# Patient Record
Sex: Female | Born: 1935 | Race: White | Hispanic: No
Health system: Southern US, Community
[De-identification: ages and names within clinical notes are randomized; demographics above are authoritative.]

---

## 2004-07-12 ENCOUNTER — Ambulatory Visit: Payer: Self-pay | Admitting: Internal Medicine

## 2010-01-18 ENCOUNTER — Ambulatory Visit: Payer: Self-pay | Admitting: Internal Medicine

## 2010-05-28 ENCOUNTER — Ambulatory Visit: Payer: Self-pay | Admitting: Unknown Physician Specialty

## 2012-09-15 ENCOUNTER — Ambulatory Visit: Payer: Self-pay | Admitting: Internal Medicine

## 2012-09-16 ENCOUNTER — Ambulatory Visit: Payer: Self-pay | Admitting: Internal Medicine

## 2013-03-17 LAB — CBC
HGB: 12.7 g/dL (ref 12.0–16.0)
MCH: 30.6 pg (ref 26.0–34.0)
Platelet: 190 10*3/uL (ref 150–440)
RBC: 4.15 10*6/uL (ref 3.80–5.20)
WBC: 22.1 10*3/uL — ABNORMAL HIGH (ref 3.6–11.0)

## 2013-03-17 LAB — URINALYSIS, COMPLETE
Bilirubin,UR: NEGATIVE
Glucose,UR: NEGATIVE mg/dL (ref 0–75)
Ph: 5 (ref 4.5–8.0)
Protein: 30
Specific Gravity: 1.015 (ref 1.003–1.030)
Squamous Epithelial: NONE SEEN
WBC UR: 89 /HPF (ref 0–5)

## 2013-03-17 LAB — COMPREHENSIVE METABOLIC PANEL
Albumin: 3.4 g/dL (ref 3.4–5.0)
Chloride: 104 mmol/L (ref 98–107)
Co2: 21 mmol/L (ref 21–32)
Creatinine: 1.93 mg/dL — ABNORMAL HIGH (ref 0.60–1.30)
EGFR (African American): 27 — ABNORMAL LOW
EGFR (Non-African Amer.): 23 — ABNORMAL LOW
SGPT (ALT): 24 U/L (ref 12–78)
Total Protein: 6.9 g/dL (ref 6.4–8.2)

## 2013-03-17 LAB — LIPASE, BLOOD: Lipase: 78 U/L (ref 73–393)

## 2013-03-18 ENCOUNTER — Inpatient Hospital Stay: Payer: Self-pay | Admitting: Internal Medicine

## 2013-03-18 LAB — CBC WITH DIFFERENTIAL/PLATELET
Bands: 12 %
HCT: 32.7 % — ABNORMAL LOW (ref 35.0–47.0)
HGB: 11.4 g/dL — ABNORMAL LOW (ref 12.0–16.0)
Lymphocytes: 7 %
MCH: 31.2 pg (ref 26.0–34.0)
Metamyelocyte: 2 %
Monocytes: 2 %
RDW: 13.3 % (ref 11.5–14.5)
WBC: 23.5 10*3/uL — ABNORMAL HIGH (ref 3.6–11.0)

## 2013-03-18 LAB — BASIC METABOLIC PANEL
Anion Gap: 8 (ref 7–16)
BUN: 30 mg/dL — ABNORMAL HIGH (ref 7–18)
Calcium, Total: 7.9 mg/dL — ABNORMAL LOW (ref 8.5–10.1)
Chloride: 111 mmol/L — ABNORMAL HIGH (ref 98–107)
Glucose: 109 mg/dL — ABNORMAL HIGH (ref 65–99)
Osmolality: 284 (ref 275–301)

## 2013-03-18 LAB — CLOSTRIDIUM DIFFICILE(ARMC)

## 2013-03-19 LAB — CBC WITH DIFFERENTIAL/PLATELET
Basophil #: 0 10*3/uL (ref 0.0–0.1)
Basophil %: 0.2 %
Eosinophil #: 0.1 10*3/uL (ref 0.0–0.7)
Eosinophil %: 0.5 %
HCT: 29.3 % — ABNORMAL LOW (ref 35.0–47.0)
HGB: 10.2 g/dL — ABNORMAL LOW (ref 12.0–16.0)
Lymphocyte #: 2.1 10*3/uL (ref 1.0–3.6)
MCH: 31.2 pg (ref 26.0–34.0)
MCV: 90 fL (ref 80–100)
Monocyte %: 4.9 %
Neutrophil #: 15.2 10*3/uL — ABNORMAL HIGH (ref 1.4–6.5)
Neutrophil %: 82.9 %
Platelet: 155 10*3/uL (ref 150–440)
RDW: 13.3 % (ref 11.5–14.5)
WBC: 18.3 10*3/uL — ABNORMAL HIGH (ref 3.6–11.0)

## 2013-03-19 LAB — BASIC METABOLIC PANEL
BUN: 22 mg/dL — ABNORMAL HIGH (ref 7–18)
Chloride: 115 mmol/L — ABNORMAL HIGH (ref 98–107)
Co2: 18 mmol/L — ABNORMAL LOW (ref 21–32)
Creatinine: 1.32 mg/dL — ABNORMAL HIGH (ref 0.60–1.30)
EGFR (African American): 42 — ABNORMAL LOW
Glucose: 80 mg/dL (ref 65–99)
Sodium: 142 mmol/L (ref 136–145)

## 2013-03-19 LAB — LIPID PANEL
HDL Cholesterol: 15 mg/dL — ABNORMAL LOW (ref 40–60)
Triglycerides: 134 mg/dL (ref 0–200)

## 2013-03-19 LAB — MAGNESIUM: Magnesium: 1.5 mg/dL — ABNORMAL LOW

## 2013-03-19 LAB — HEMOGLOBIN A1C: Hemoglobin A1C: 5.9 % (ref 4.2–6.3)

## 2013-03-19 LAB — TSH: Thyroid Stimulating Horm: 8.61 u[IU]/mL — ABNORMAL HIGH

## 2013-03-19 LAB — URINE CULTURE

## 2013-03-22 LAB — CULTURE, BLOOD (SINGLE)

## 2013-04-07 ENCOUNTER — Ambulatory Visit: Payer: Self-pay | Admitting: Internal Medicine

## 2013-05-17 ENCOUNTER — Ambulatory Visit: Payer: Self-pay | Admitting: Internal Medicine

## 2014-09-02 NOTE — H&P (Signed)
PATIENT NAME:  Danielle HamburgerSURBER, Cj S MR#:  [id number] DATE OF BIRTH:  05/14/1924  DATE OF ADMISSION:  03/18/2013  PRIMARY CARE PHYSICIAN: Dr. Dewaine Oatsenny Tate  REFERRING PHYSICIAN:  Dr. Cyril LoosenKinner  CHIEF COMPLAINT:  Confusion and one episode of vomiting.   HISTORY OF PRESENT ILLNESS: The patient is an 81 year old pleasant Caucasian female with past medical history of GERD and osteoporosis is presenting to the ER with a chief complaint of altered mental status. The patient was in her usual state of health until the day before yesterday. Yesterday afternoon she called her sister and told her that she was not feeling well. She was sick on her stomach, felt nauseated and vomited x 1. Eventually the patient was found to be quite confused.  Son stated that his aunt called him and told him that his mom was not doing well. The patient was brought into the ER for altered mental status. In the ER, the patient had a CAT scan of the head which has revealed soft tissue swelling and scalp hematoma in the occipital area. With further questioning the son is reporting that the patient fell last week and could not recall whether she hit her in the head or not.  The son thinks that probably during that fall she sustained this scalp hematoma. The patient's son also has reported that the patient is not eating or drinking well since yesterday. Denies fever.  No similar complaints in the past.   PAST MEDICAL HISTORY:  1.  GERD.  2.  Osteoporosis.  3.  Vitamin D deficiency.   PAST SURGICAL HISTORY: None.   ALLERGIES:  No known drug allergies.   PSYCHOSOCIAL HISTORY: Lives at home with son. No history of smoking, alcohol or illicit drug usage.   FAMILY HISTORY: Mom had several transient ischemic attacks. Dad deceased with throat cancer.   HOME MEDICATIONS:  List is not available at this time, it is not reconciled.    REVIEW OF SYSTEMS:  Unobtainable as the patient is with altered mental status.   PHYSICAL EXAMINATION: VITAL  SIGNS: Temperature 97.6, pulse 93 to 94, respirations  18 to 22, blood pressure initially 104/53, during my examination 490/41 with pulse oximetry 96%.  GENERAL APPEARANCE: Not in acute distress. Moderately built and nourished.  HEENT: Normocephalic. Scalp hematomas were noted in the occipital area. Pupils are equal, reacting light and accommodation. No scleral icterus. No conjunctival injection. No sinus tenderness. No postnasal drip. Dry mucous membranes.  NECK: Supple. No JVD or thyromegaly.  LUNGS: Clear to auscultation bilaterally. No accessory muscle usage. Decreased breath sounds at the bases.  CARDIAC: S1, S2 normal. Regular rate and rhythm. No murmurs.  GASTROINTESTINAL: Soft. Bowel sounds are positive in all four quadrants. Nontender, nondistended. No hepatosplenomegaly. No masses felt.  NEUROLOGIC: Awake and alert, but disoriented. Motor and sensory are grossly intact. Reflexes are 2+.  EXTREMITIES: No edema. No cyanosis. Peripheral pulses are 2+.  PSYCHIATRIC:  Mood and affect could not be elicited as the patient is with altered mental status.  SKIN: Warm to touch. Decreased turgor, dry in nature. No rashes. No lesions.   LABORATORY AND IMAGING STUDIES: Chest x-ray portable single view has revealed small bilateral pleural effusions are new, stable, tracheal density felt related to tortuous great vessels.  CAT scan of the head without contrast has revealed soft tissue swelling in the occipital scalp bilaterally, left greater than right, suspicious cephalhematomas. No acute displaced skull fracture or acute intracranial findings. Mild cerebral and cerebellar infarct, with old cerebellar infarct,  old basal ganglia and lacunar infarct and extensive chronic microvascular ischemic changes in the cerebral white matter.   A 12-lead EKG has revealed normal sinus rhythm at 90 beats per minute, normal PR and QRS interval. Nonspecific ST-T wave changes.   LFTs: Normal except AST which is slightly  elevated at 42.   CBC, white count is elevated at 22.1. The rest of the CBC is normal.   Urinalysis: Amber color, cloudy negative glucose, bilirubin, ketones are negative, specific gravity 1.75, pH 5.0, nitrites negative, leukocyte esterase 2+.   Glucose 106, BUN 29, creatinine 1.93, sodium 139, potassium 4.1, chloride 104, CO2 is a 29. GFR 23, anion gap 10, serum osmolality 276, calcium 9.2. Lipase 78.   ASSESSMENT AND PLAN: An 81 year old Caucasian female presenting to the Emergency Room with chief complaint of altered mental status will be admitted with the following assessment and plan.  1.  Altered mental status secondary to dehydration and acute cystitis. We will admit her to telemetry, continue close monitoring.  2.  Acute cystitis. Urine cultures are obtained, we will treat her with IV Rocephin and IV fluids.  3.  Acute kidney injury from renal insufficiency/dehydration which has led to acute kidney injury, which is probably prerenal. We will provide her gentle hydration with IV fluids.  4.  Gastroesophageal reflux disease.  Provide GI prophylaxis.  5.  Skull hematoma from recent fall.  The patient is stable at this time.   6.  We will provide her gastrointestinal prophylaxis and deep vein thrombosis prophylaxis.  7.  Diagnosis and plan of care was discussed in detail with the patient's son at bedside. He was then understanding of the plan.   TOTAL TIME SPENT ON ADMISSION: 45 minutes     ____________________________ Ramonita Lab, MD ag:cc D: 03/18/2013 00:20:31 ET T: 03/18/2013 00:58:42 ET JOB#: [id number]  cc: Ramonita Lab, MD, <Dictator> Jillene Bucks. Arlana Pouch, MD Ramonita Lab MD ELECTRONICALLY SIGNED 03/28/2013 8:29

## 2014-09-02 NOTE — Discharge Summary (Signed)
PATIENT NAME:  Danielle Booth, Danielle Booth DATE OF BIRTH:  11/07/1924  DATE OF ADMISSION:  03/18/2013 DATE OF DISCHARGE:  03/19/2013   DISCHARGE DIAGNOSES: 1.  Altered mental status secondary to acute metabolic encephalopathy.  2.  Escherichia coli urinary tract infection.  3.  Hypomagnesemia.  4.  Acute renal failure.  5.  Generalized deconditioning.  6. Gastroesophageal reflux disease.  DISCHARGE MEDICATIONS: 1.  Cipro 500 mg p.o. b.i.d. for 10 days.  2.  Pantoprazole 40 mg p.o. daily.  3.  Fosamax 70 mg once a week.  DISCHARGE DISPOSITION: Discharge home with home health.   HOSPITAL COURSE: This is an 81 year old female brought in because of confusion and vomiting. The patient was in good health the day before when she came, and the patient admitted for confusion, found to have a urinary tract infection and initially admitted to CCU stepdown. The patient'Booth CT head did not show any acute intracranial changes, but has cephalohematoma. White count was 22.1 and urine was cloudy with 2+ leukocyte esterase and also positive  for acute renal failure with BUN of 29 and creatinine 1.93. The patient was found to have metabolic encephalopathy secondary to renal failure and UTI, started on IV Rocephin and IV fluids. The patient moved to telemetry the afternoon of the 6th. The patient'Booth mental status improved nicely, and did not have any more fever and the patient'Booth renal function improved with fluids, and urine culture showed E. coli UTI sensitive to all the antibiotics. The patient was seen by physical therapy, recommended home health. The patient was discharged home with home health and Cipro. The patient also had hypomagnesemia on  admission at 1.5, given p.o. magnesium supplements.   OTHER LABORATORY DATA: Showed C. difficile toxins were negative. Initial white count 23.5. Initial renal function showed a BUN of 29, creatinine 1.93 and blood culture did not show any growth. Urine culture showed E.  coli UTI,  more than 100,000 colonies sensitive to all the antibiotics. EKG showed normal sinus rhythm, 90 beats per minute. Chest x-ray did not show any acute changes. The patient'Booth creatinine now normal on the 7th at 1.8. BUN 22 and white count normal 17.3. The patient remained afebrile, remained hemodynamically stable, discharged home in stable condition. Of note, the patient'Booth TSH was elevated slightly at 8.61.   PRIMARY CARE PHYSICIAN:  Dr. Dewaine Oatsenny Tate. She needs  repeat TSH levels in three months.   Time spent on discharge preparation: More than 30 minutes.    ____________________________ Danielle HammingSnehalatha Corin Formisano, MD sk:NTS D: 03/20/2013 22:54:20 ET T: 03/21/2013 04:51:54 ET JOB#: [id number]  cc: Danielle HammingSnehalatha Breann Losano, MD, <Dictator> Jillene Bucksenny C. Arlana Pouchate, MD Danielle HammingSNEHALATHA Lindyn Vossler MD ELECTRONICALLY SIGNED 03/31/2013 12:12

## 2014-10-01 IMAGING — CR DG CHEST 1V PORT
1 series · 1 of 1 positions shown · non-contrast
Comparison: 09/16/2012.

CLINICAL DATA: 88-year- female with weakness shortness of Breath
confusion and leukocytosis. Initial encounter.

EXAM:
PORTABLE CHEST - 1 VIEW

[ap]
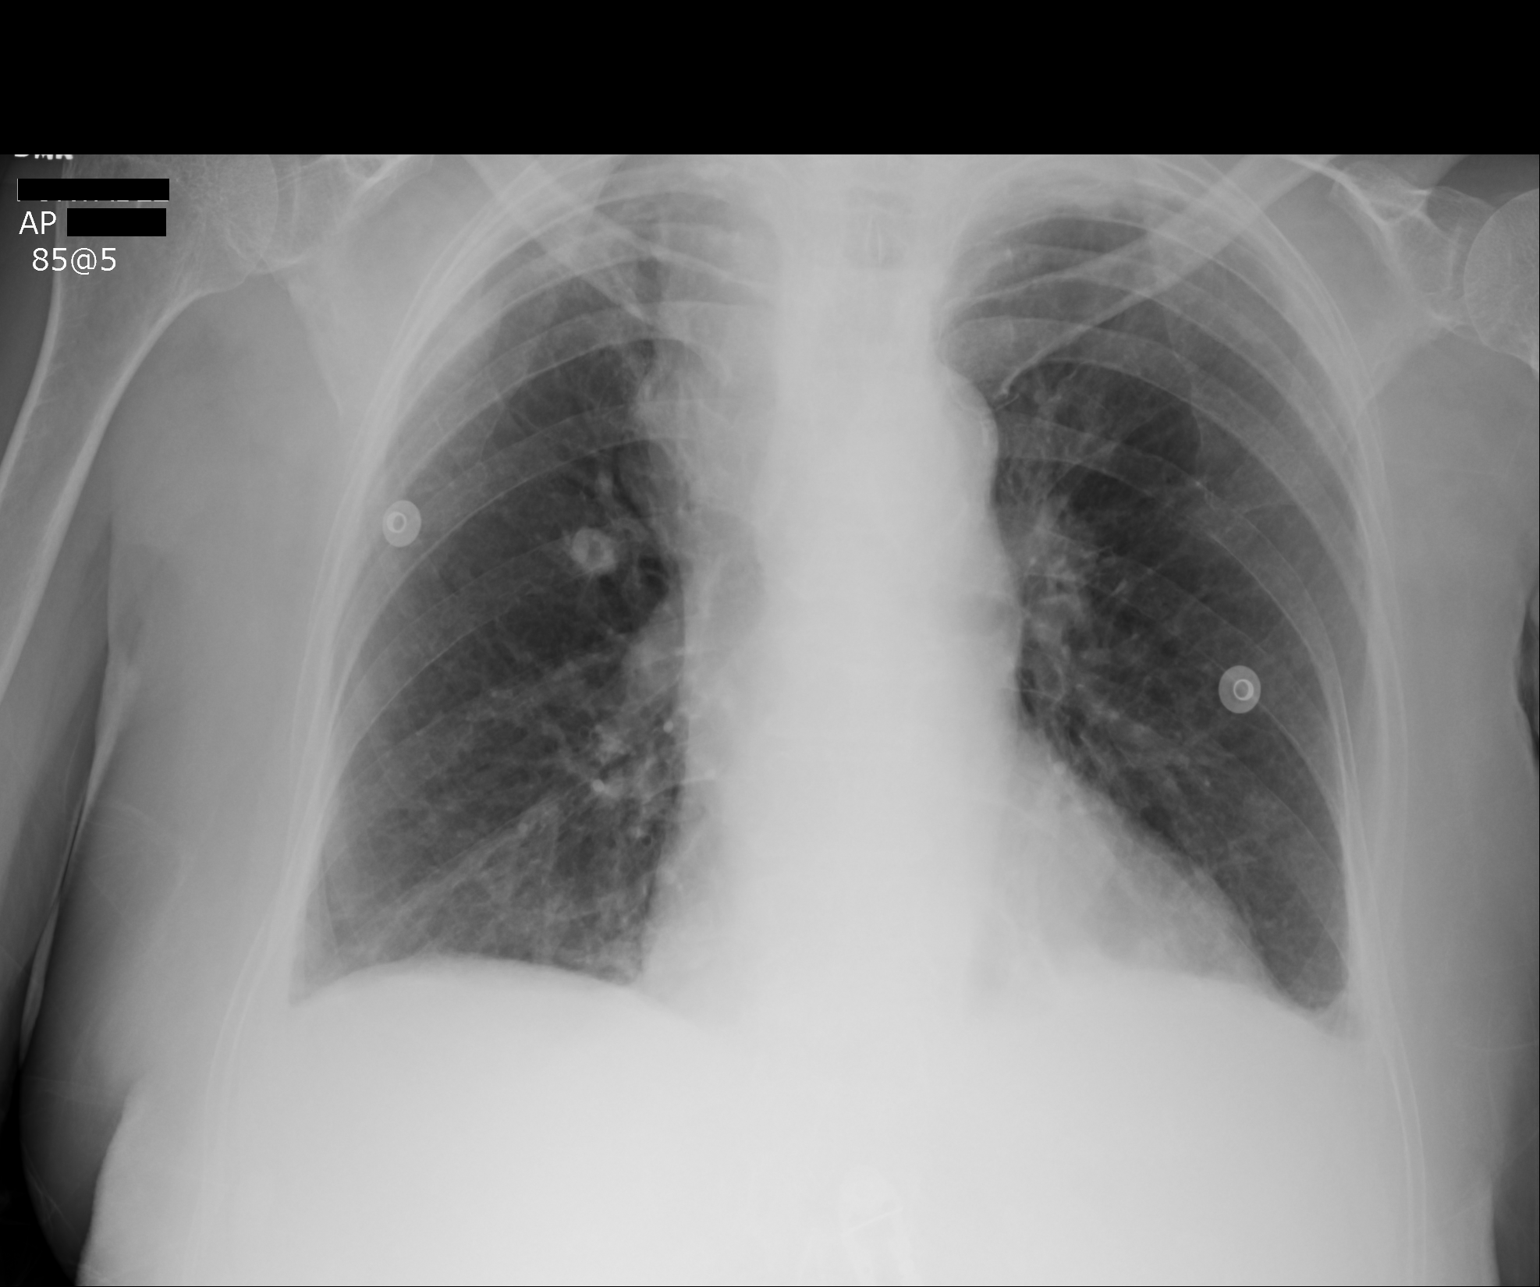

[1 of 1 positions shown; findings below may reference images not displayed]

FINDINGS: Portable AP upright view at 4727 hrs. Small bilateral pleural
effusions are new or increased. Stable cardiac size and mediastinal
contours. Increased right peritracheal density is stable, favor
related to vascular tortuosity. No pneumothorax or edema. No
consolidation identified.
IMPRESSION: Small bilateral pleural effusions are new or increased.

Stable increased right peritracheal density, favor related to
tortuous great vessels.

## 2014-10-01 IMAGING — CT CT HEAD WITHOUT CONTRAST
1 series · 15 of 27 positions shown, 19 images · non-contrast
Comparison: No priors.

CLINICAL DATA: Altered mental status.

EXAM:
CT HEAD WITHOUT CONTRAST
TECHNIQUE: Contiguous axial images were obtained from the base of the skull
through the vertex without intravenous contrast.

[Series 2: head wo · axial · 0.42mm/px · z∈[-69,+51]mm · 15 of 27 slices shown, 19 images]
[im 2/27  brain]
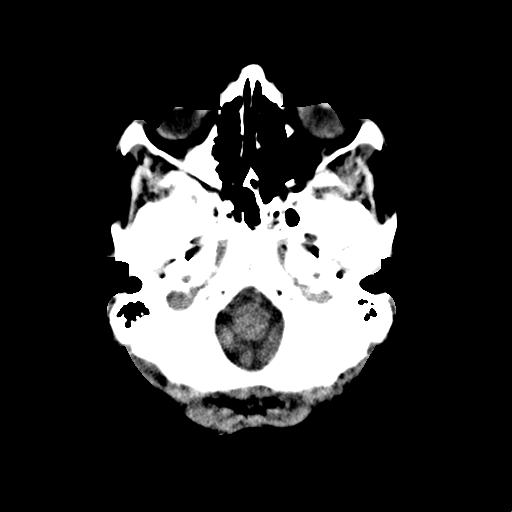
[im 2/27  bone]
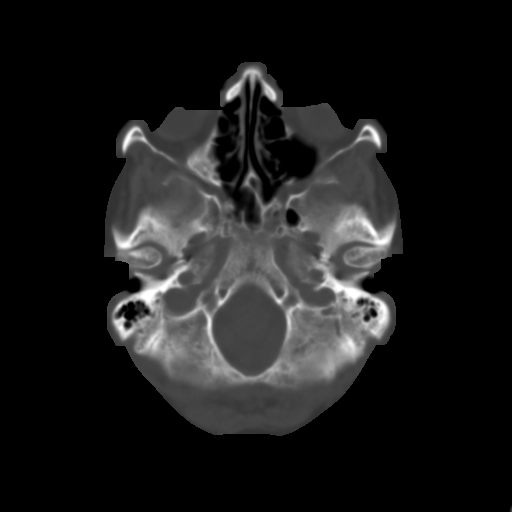
[im 4/27  brain]
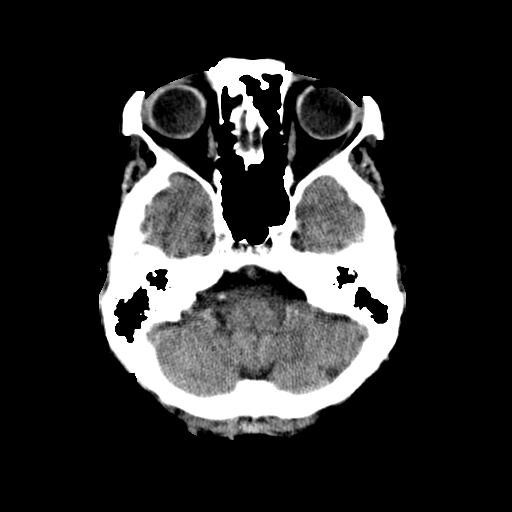
[im 6/27  brain]
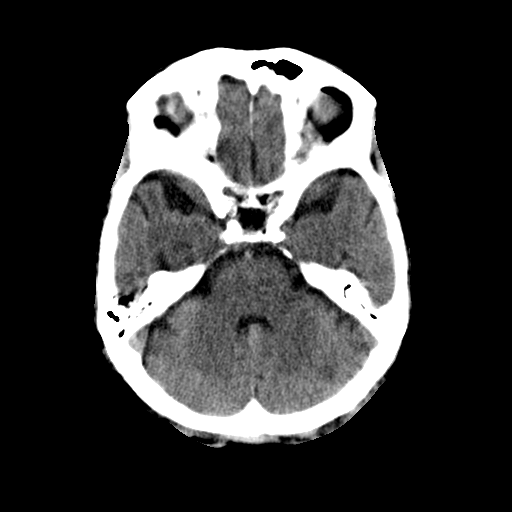
[im 7/27  brain]
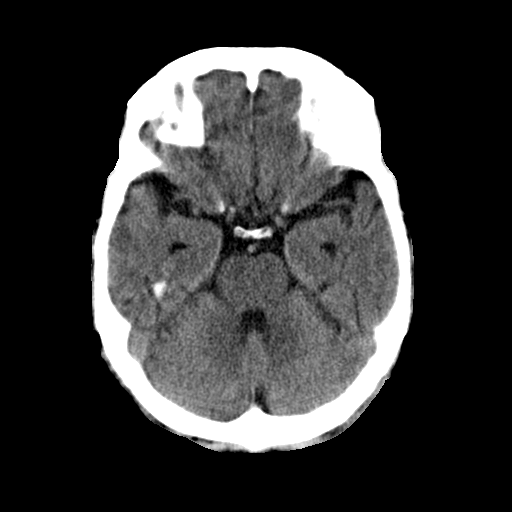
[im 9/27  brain]
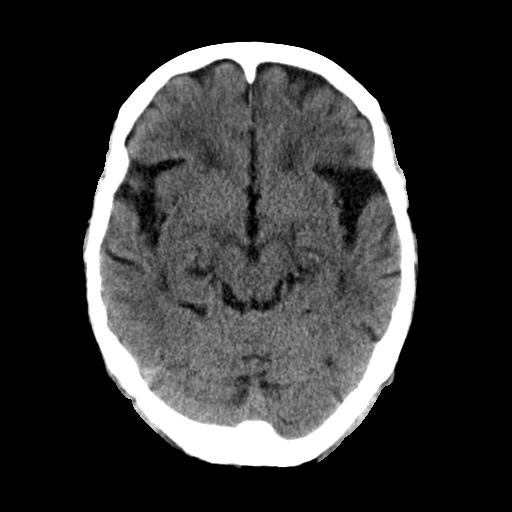
[im 9/27  bone]
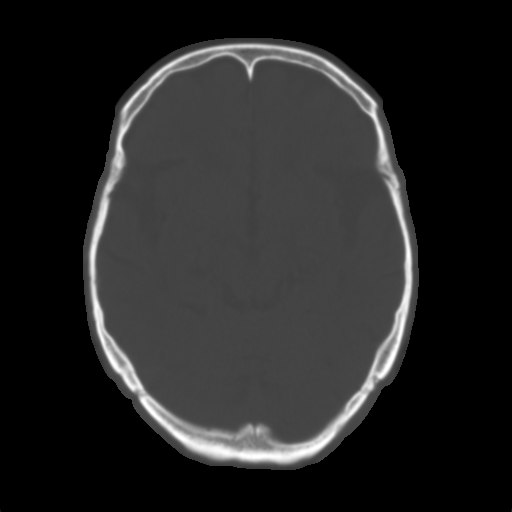
[im 11/27  brain]
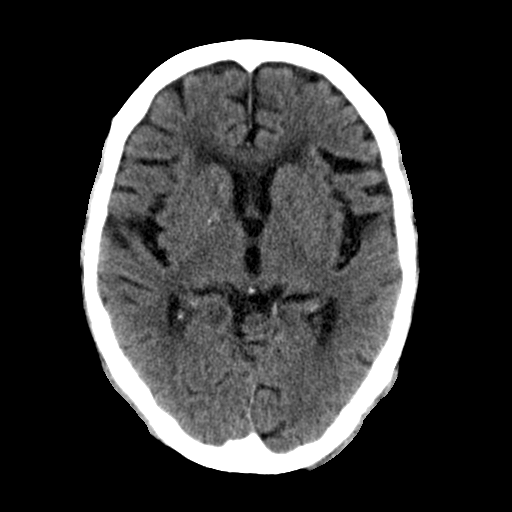
[im 12/27  brain]
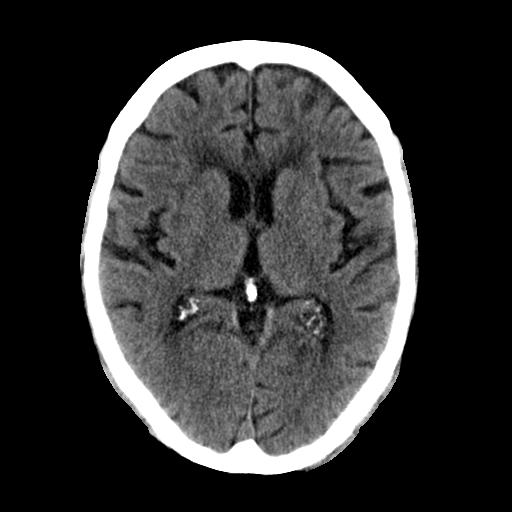
[im 14/27  brain]
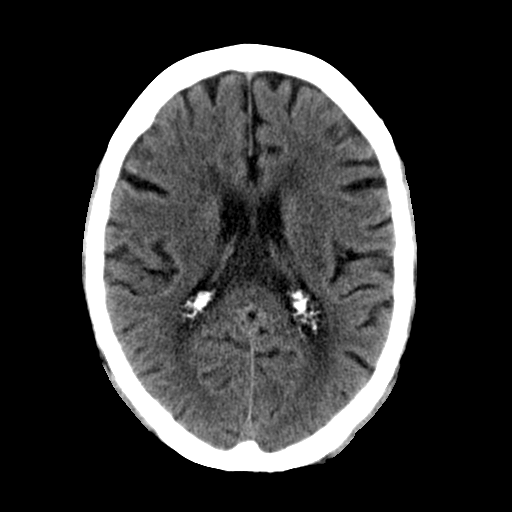
[im 16/27  brain]
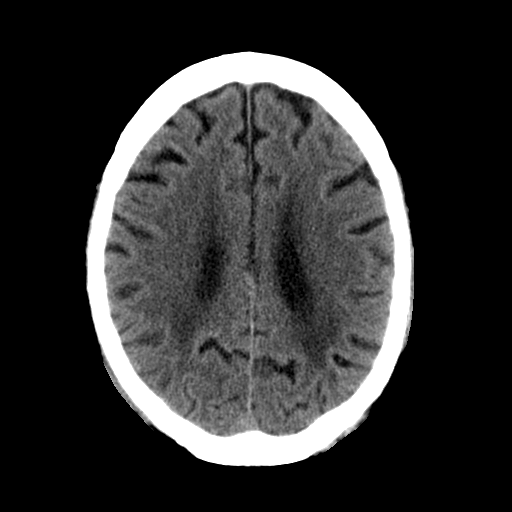
[im 16/27  bone]
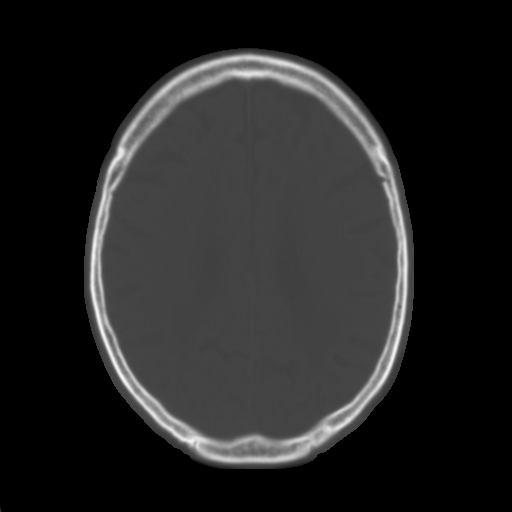
[im 17/27  brain]
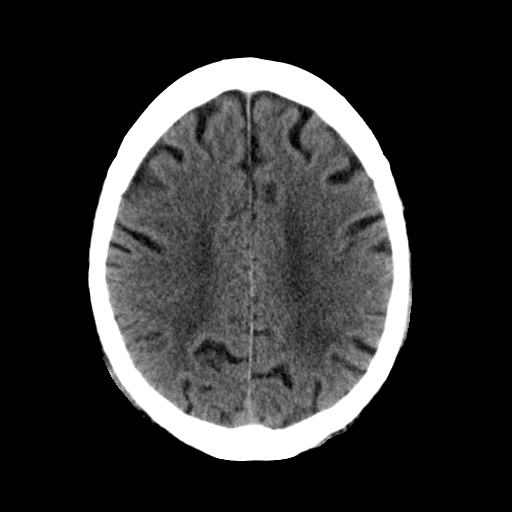
[im 19/27  brain]
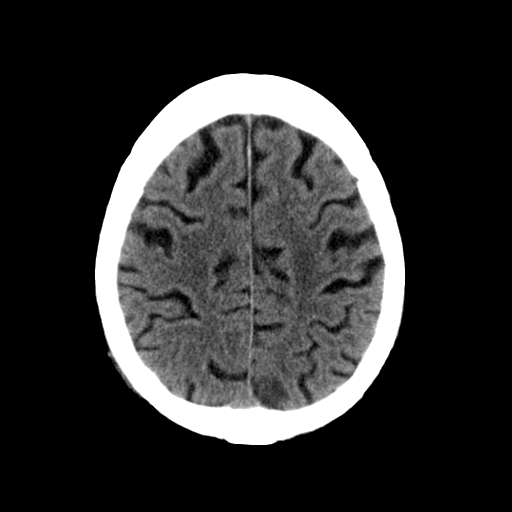
[im 21/27  brain]
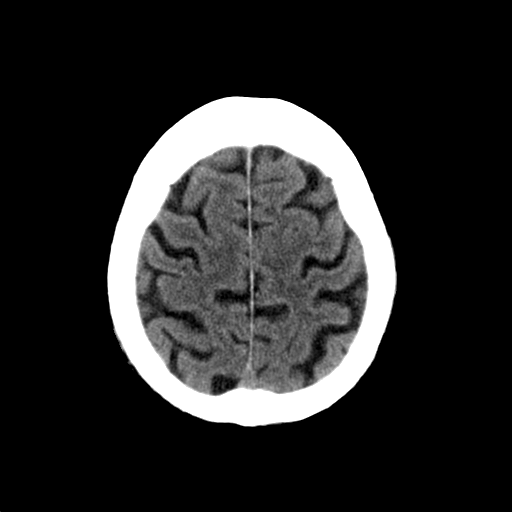
[im 22/27  brain]
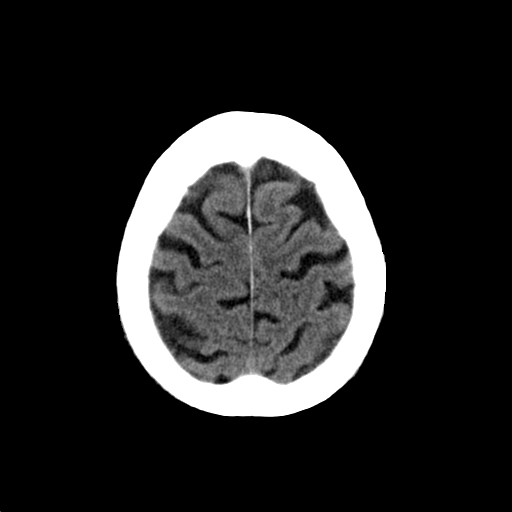
[im 22/27  bone]
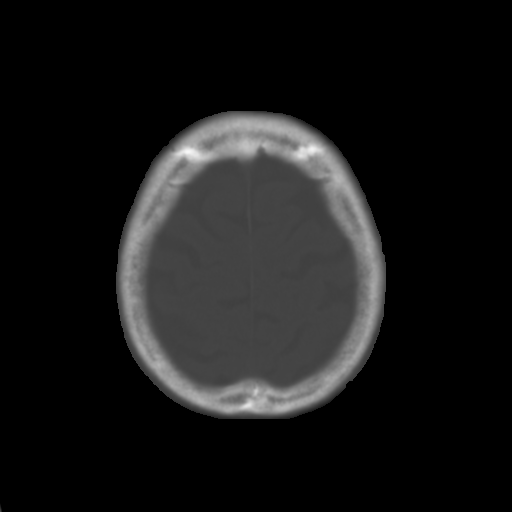
[im 24/27  brain]
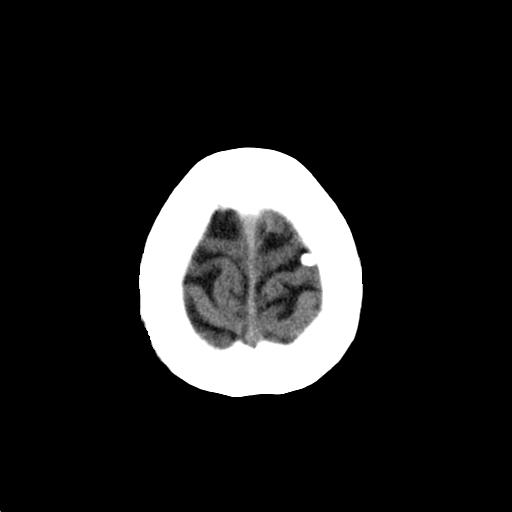
[im 26/27  brain]
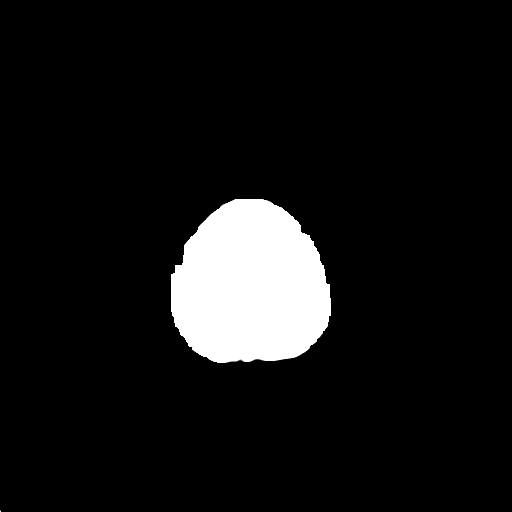

[15 of 27 positions shown; findings below may reference images not displayed]

FINDINGS: Soft tissue swelling in the occipital scalp bilaterally (left
greater than right), suspicious for a small hematomas. Physiologic
calcifications in the right basal ganglia. Old lacunar infarction in
the right globus pallidus. Old infarct in the low left cerebellar
hemisphere. Mild cerebral and cerebellar atrophy. Patchy and
confluent areas of decreased attenuation are noted throughout the
deep and periventricular white matter of the cerebral hemispheres
bilaterally, compatible with chronic microvascular ischemic disease.
No acute displaced skull fractures are identified. No acute
intracranial abnormality. Specifically, no evidence of acute
post-traumatic intracranial hemorrhage, no definite regions of
acute/subacute cerebral ischemia, no focal mass, mass effect,
hydrocephalus or abnormal intra or extra-axial fluid collections.
The visualized paranasal sinuses and mastoids are well pneumatized.
IMPRESSION: 1. Soft tissue swelling in the occipital scalp bilaterally (left
greater than right), suspicious for scalp hematomas. No acute
displaced skull fracture or acute intracranial findings.
2. Mild cerebral and cerebellar atrophy with old left cerebellar
infarct, old right basal ganglia lacunar infarct, and extensive
chronic microvascular ischemic changes in the cerebral white matter.

## 2014-10-18 ENCOUNTER — Other Ambulatory Visit: Payer: Self-pay | Admitting: Internal Medicine

## 2014-10-18 ENCOUNTER — Ambulatory Visit
Admission: RE | Admit: 2014-10-18 | Discharge: 2014-10-18 | Disposition: A | Discharge status: Home / Self Care | Payer: Medicare Other | Source: Ambulatory Visit | Attending: Internal Medicine | Admitting: Internal Medicine

## 2014-10-18 DIAGNOSIS — R0609 Other forms of dyspnea: Secondary | ICD-10-CM | POA: Diagnosis present

## 2014-10-18 DIAGNOSIS — R0600 Dyspnea, unspecified: Secondary | ICD-10-CM

## 2014-10-18 DIAGNOSIS — J90 Pleural effusion, not elsewhere classified: Secondary | ICD-10-CM | POA: Diagnosis not present

## 2014-10-20 ENCOUNTER — Ambulatory Visit
Admission: RE | Admit: 2014-10-20 | Discharge: 2014-10-20 | Disposition: A | Discharge status: Home / Self Care | Payer: Medicare Other | Source: Ambulatory Visit | Attending: Cardiology | Admitting: Cardiology

## 2014-10-20 ENCOUNTER — Other Ambulatory Visit: Payer: Self-pay | Admitting: Cardiology

## 2014-10-20 DIAGNOSIS — R609 Edema, unspecified: Secondary | ICD-10-CM

## 2014-10-20 DIAGNOSIS — R52 Pain, unspecified: Secondary | ICD-10-CM

## 2014-10-20 DIAGNOSIS — R600 Localized edema: Secondary | ICD-10-CM | POA: Diagnosis present

## 2014-10-24 ENCOUNTER — Ambulatory Visit: Payer: Medicare Other

## 2015-11-11 DEATH — deceased

## 2016-09-14 IMAGING — US US EXTREM LOW VENOUS BILAT
1 series · 13 of 24 positions shown · non-contrast
Comparison: None.

CLINICAL DATA: Bilateral lower extremity pain and edema. Evaluate
for DVT.



[Series 1: us extrem low venous bilat · 0.09mm/px · 13 of 48 slices shown]
[im 1/48]
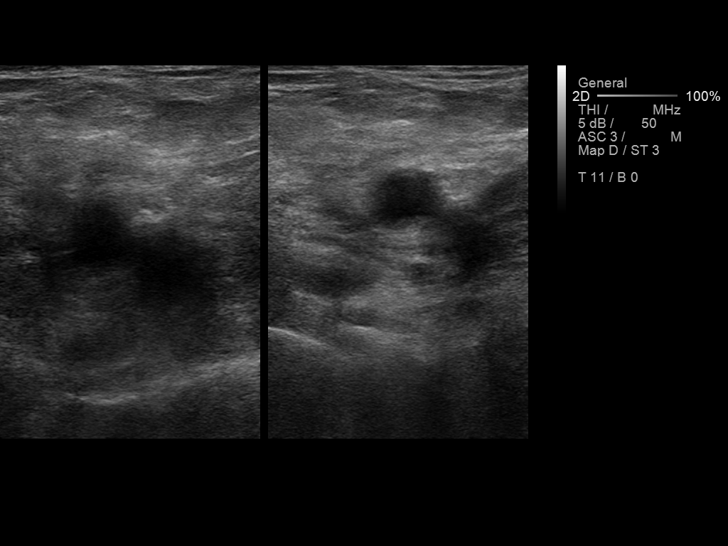
[im 5/48]
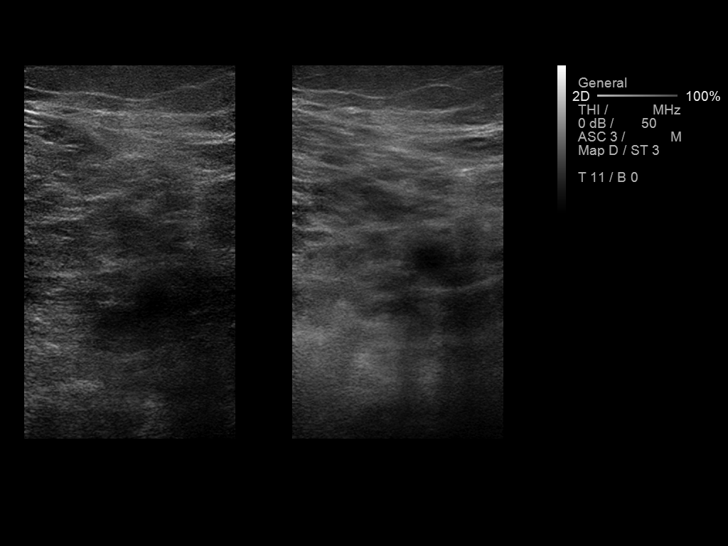
[im 9/48]
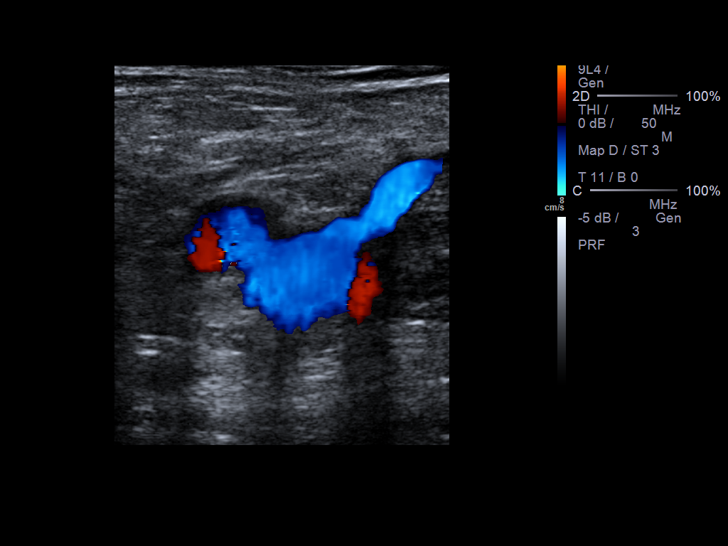
[im 13/48]
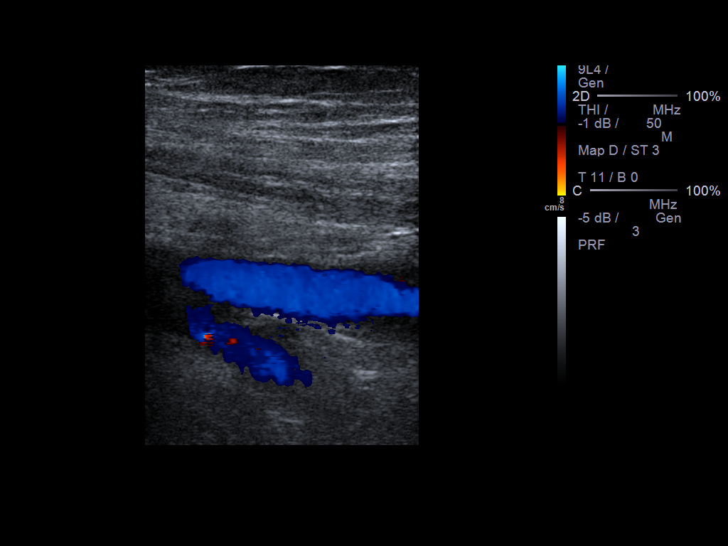
[im 17/48]
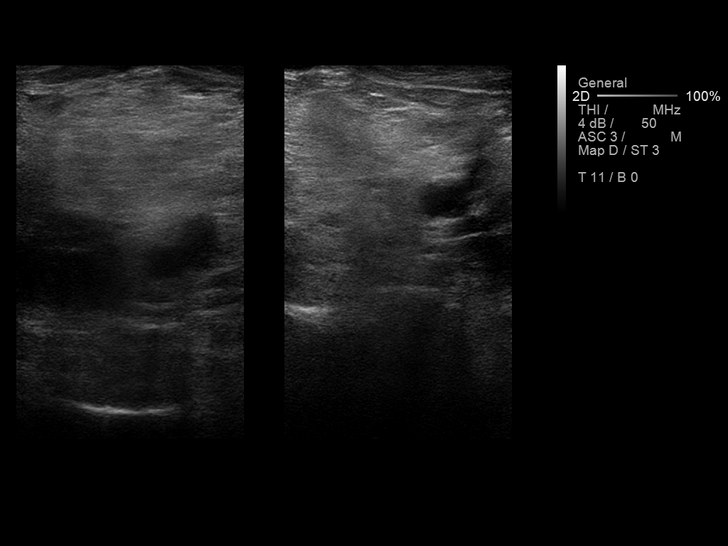
[im 21/48]
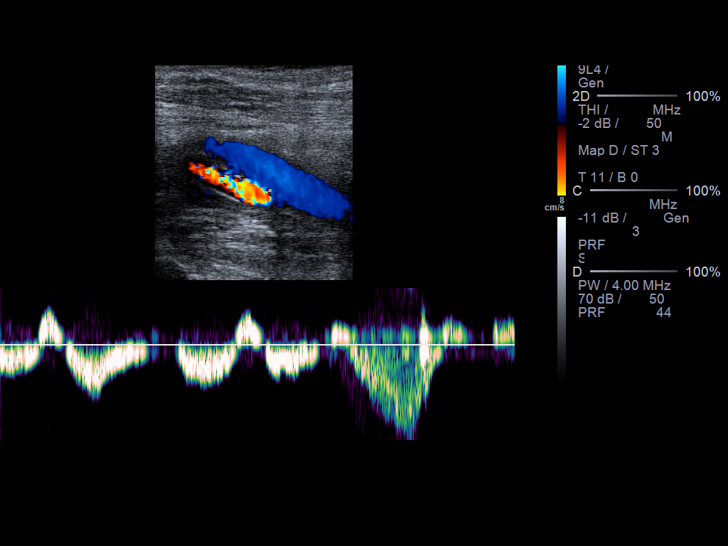
[im 25/48]
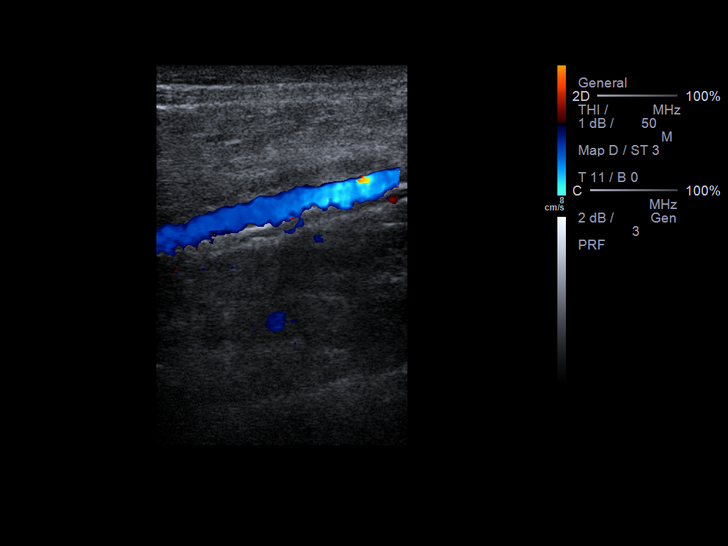
[im 27/48]
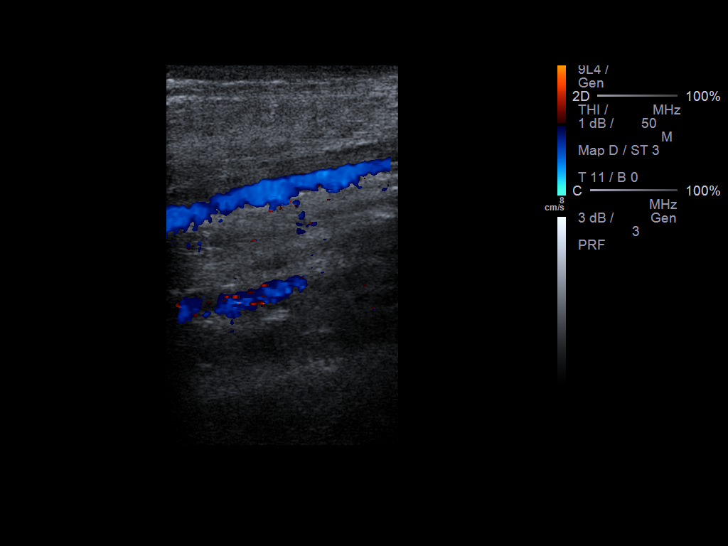
[im 31/48]
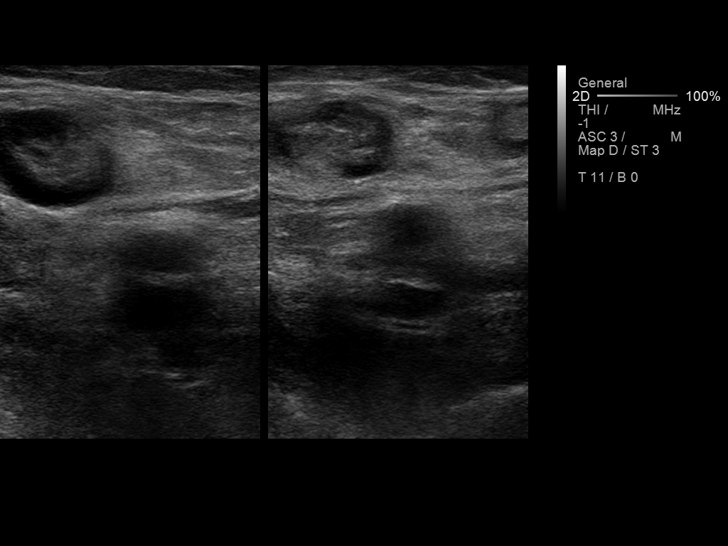
[im 35/48]
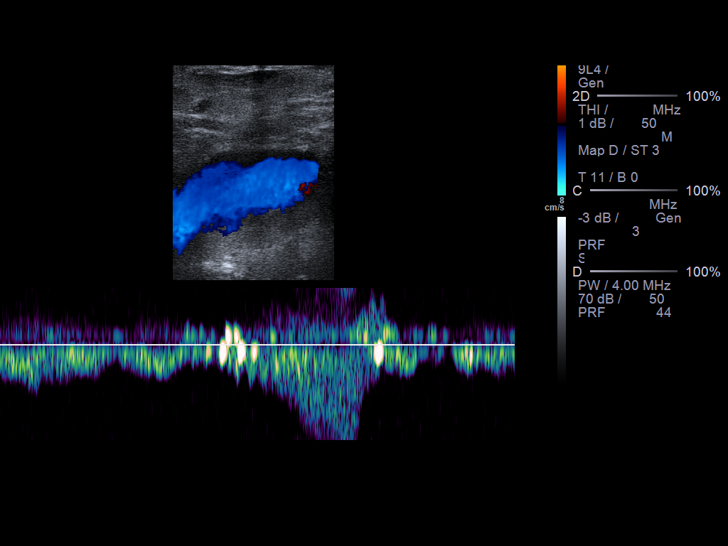
[im 39/48]
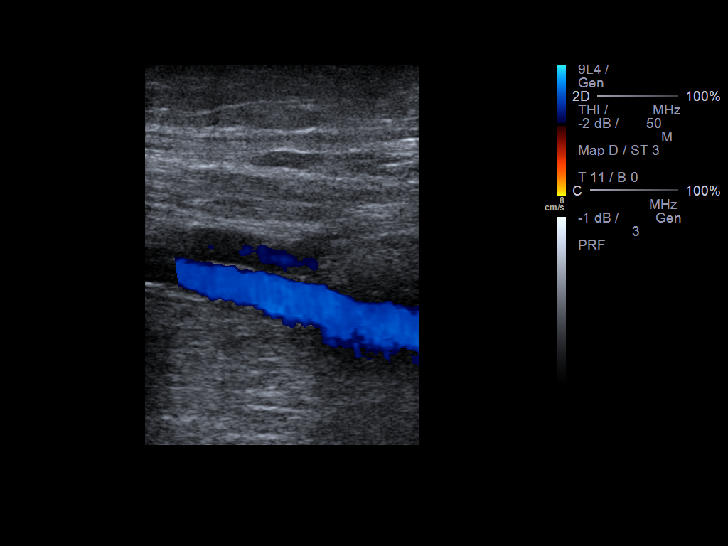
[im 43/48]
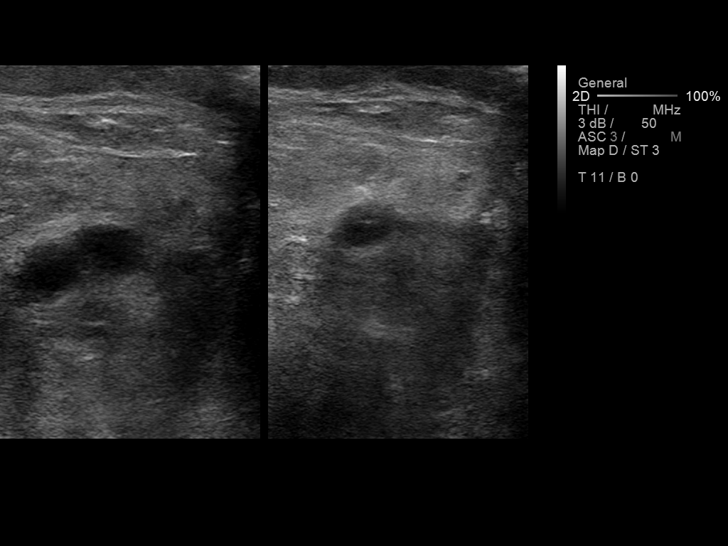
[im 48/48]
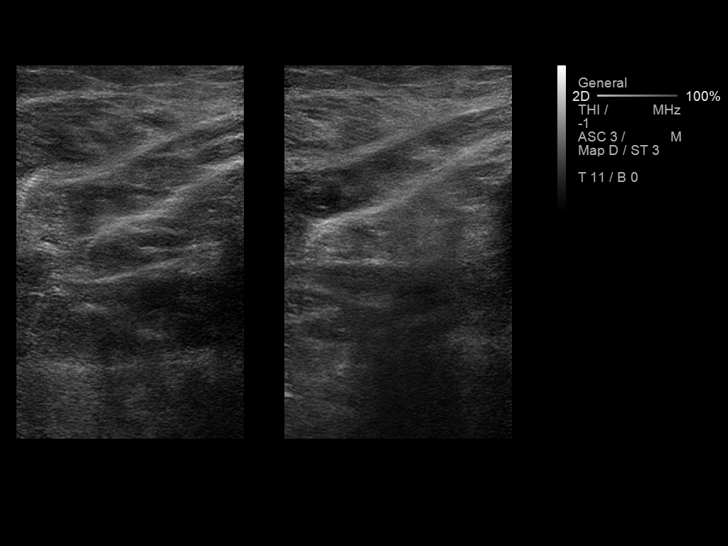

[13 of 24 positions shown; findings below may reference images not displayed]

FINDINGS: RIGHT LOWER EXTREMITY

Common Femoral Vein: No evidence of thrombus. Normal
compressibility, respiratory phasicity and response to augmentation.

Saphenofemoral Junction: No evidence of thrombus. Normal
compressibility and flow on color Doppler imaging.

Profunda Femoral Vein: No evidence of thrombus. Normal
compressibility and flow on color Doppler imaging.

Femoral Vein: No evidence of thrombus. Normal compressibility,
respiratory phasicity and response to augmentation.

Popliteal Vein: No evidence of thrombus. Normal compressibility,
respiratory phasicity and response to augmentation.

Calf Veins: No evidence of thrombus. Normal compressibility and flow
on color Doppler imaging.

Superficial Great Saphenous Vein: No evidence of thrombus. Normal
compressibility and flow on color Doppler imaging.

Venous Reflux:  None.

Other Findings:  None.

LEFT LOWER EXTREMITY

Common Femoral Vein: No evidence of thrombus. Normal
compressibility, respiratory phasicity and response to augmentation.

Saphenofemoral Junction: No evidence of thrombus. Normal
compressibility and flow on color Doppler imaging.

Profunda Femoral Vein: No evidence of thrombus. Normal
compressibility and flow on color Doppler imaging.

Femoral Vein: No evidence of thrombus. Normal compressibility,
respiratory phasicity and response to augmentation.

Popliteal Vein: No evidence of thrombus. Normal compressibility,
respiratory phasicity and response to augmentation.

Calf Veins: No evidence of thrombus. Normal compressibility and flow
on color Doppler imaging.

Superficial Great Saphenous Vein: No evidence of thrombus. Normal
compressibility and flow on color Doppler imaging.

Venous Reflux:  None.

Other Findings: A benign appearing left inguinal lymph node is
incidentally noted, not enlarged by size criteria, measuring
approximately 1 cm in greatest short axis diameter and maintaining a
benign fatty hilum.
IMPRESSION: No evidence of DVT within either lower extremity.
